# Patient Record
Sex: Male | Born: 2000 | Race: Black or African American | Hispanic: No | Marital: Single | Smoking: Never smoker
Health system: Southern US, Community
[De-identification: ages and names within clinical notes are randomized; demographics above are authoritative.]

## PROBLEM LIST (undated history)

## (undated) DIAGNOSIS — J40 Bronchitis, not specified as acute or chronic: Secondary | ICD-10-CM

## (undated) DIAGNOSIS — L309 Dermatitis, unspecified: Secondary | ICD-10-CM

## (undated) HISTORY — PX: OTHER SURGICAL HISTORY: SHX169

## (undated) HISTORY — PX: HERNIA REPAIR: SHX51

---
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
## 2022-01-01 ENCOUNTER — Ambulatory Visit: Payer: Medicaid Other | Admitting: Internal Medicine

## 2022-01-07 NOTE — Progress Notes (Unsigned)
FOLLOW UP Date of Service/Encounter:  01/08/22   Subjective:  Caleb Clarke (DOB: 04/18/2000) is a 21 y.o. male who [facility] in re-evaluation of the following: asthma, allergic rhinitis  History obtained from: chart review and patient.  For Review, LV was on 10/02/21  with Dr.Brylin Stanislawski seen for routine follow-up. His FEV1 was 38%.  He reported improvement since starting Fasenra, but had ran out of his Symbicort prior to that visit.  Had only received 2 Fasenra injections.  We refilled his medications, and had him continue rest of plan.  ----------------------- Pertinent history/diagnostics: Asthma:  diagnosed in early childhood, 2 lifetime hospitalizations, COVID-19 infection in 2021, wheezing multiple times per week. . Not controlled on Symbicort 160, 2 puffs BID.  Fasenra started 08/14/2021 -- 07/10/21 spirometry: ratio 70%, 49% FEV1 (pre), Spirometry consistent with mixed obstructive and restrictive disease without significant bronchodilator response -- CXR 04/30/18: normal read, AEC 0 -- 07/10/21: AEC 200, IgE 871 Allergic Rhinitis:  -- SPT environmental panel (07/10/21): positive to grass pollen, weed pollen, tree pollen, indoor/outdoor pollens, dust mites, cat, horse and cockroach Food Allergy (shellfish) Hx of reaction: none; has avoided since childhood due to strong family history of shellfish allergy -- SPT select foods (07/10/21): + shellfish Hx of penicillin allergy:  doesn't remember history, told this since childhood-challenge offered ------------------------------------ Today presents for follow-up. He did have an asthma flare earlier this month and was evaluated by PCP during this time.  He was given trelegy to start. However, he has been taking Trelegy along with his Symbicort and Spiriva. He was given prednisone in office, but he did not continue for the 5 day course. He also got a nebulizer in clinic.  He was also given  prednisone following an ED visit in September for body aches. Household was sick at this time.  He was also treated with albuterol during this ED visit. Since September he feels his asthma has not been controlled. He is still using rescue inhaler at least 2-3 times per week. Using in setting of work, when he physically or emotionally exerts himself.  He feels better after using his rescue inhaler.   He has had no accidental exposures to shellfish.   He does feel that his allergies are controlled with Zyrtec and Singulair plus Flonase.  Allergies as of 01/08/2022       Reactions   Shellfish Allergy    Amoxicillin Other (See Comments)   unknown   Penicillins Rash   REACTION: rash        Medication List        Accurate as of January 08, 2022  5:26 PM. If you have any questions, ask your nurse or doctor.          albuterol (2.5 MG/3ML) 0.083% nebulizer solution Commonly known as: PROVENTIL Take 3 mLs (2.5 mg total) by nebulization every 4 (four) hours as needed for wheezing or shortness of breath.   albuterol 108 (90 Base) MCG/ACT inhaler Commonly known as: VENTOLIN HFA Inhale 2 puffs into the lungs every 4 (four) hours as needed for wheezing or shortness of breath.   cetirizine 10 MG tablet Commonly known as: ZYRTEC Take 1 tablet (10 mg total) by mouth daily.   cromolyn 4 % ophthalmic solution Commonly known as: OPTICROM Place 1 drop into both eyes 4 (four) times daily as needed.   EPINEPHrine 0.3 mg/0.3 mL Soaj injection Commonly known as: EpiPen 2-Pak Inject 0.3 mg into the muscle  once as needed for up to 1 dose for anaphylaxis.   fluticasone 50 MCG/ACT nasal spray Commonly known as: FLONASE Place 2 sprays into both nostrils daily.   montelukast 10 MG tablet Commonly known as: SINGULAIR Take 1 tablet (10 mg total) by mouth at bedtime.   Olopatadine HCl 0.2 % Soln Apply 1 drop to eye daily as needed.   Trelegy Ellipta 100-62.5-25 MCG/ACT Aepb Generic drug:  Fluticasone-Umeclidin-Vilant Inhale 1 puff into the lungs daily.       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  ROS: All others negative except as noted per HPI.   Objective:  BP 128/72   Pulse (!) 105   Temp 98.7 F (37.1 C)   Resp 20   Ht 5\' 7"  (1.702 m)   Wt 275 lb 11.2 oz (125.1 kg)   SpO2 96%   BMI 43.18 kg/m  Body mass index is 43.18 kg/m. Physical Exam: General Appearance:  Alert, cooperative, no distress, appears stated age  Head:  Normocephalic, without obvious abnormality, atraumatic  Eyes:  Conjunctiva clear, EOM's intact  Nose: Nares normal,  nasal crease, hypertrophic turbinates, normal mucosa, no visible anterior polyps, and septum midline  Throat: Lips, tongue normal; teeth and gums normal, normal posterior oropharynx  Neck: Supple, symmetrical  Lungs:   clear to auscultation bilaterally, Respirations unlabored, no coughing  Heart:  regular rate and rhythm and no murmur, Appears well perfused  Extremities: No edema  Skin: Skin color, texture, turgor normal, no rashes or lesions on visualized portions of skin  Neurologic: No gross deficits   Spirometry:  Tracings reviewed. His effort: Good reproducible efforts. FVC: 2.97L FEV1: 1.96L, 53% predicted FEV1/FVC ratio: 76% Interpretation: Spirometry consistent with mixed obstructive and restrictive disease.  Please see scanned spirometry results for details.  Assessment/Plan   Severe Persistent Asthma: not controlled Breathing test continues to show obstruction, but improved from prior visit -  Controller Inhaler: Continue Trelegy 1 puff daily. Do not use Symbicort or Spiriva with Trelegy - Rinse mouth out after use - Rescue Inhaler: Albuterol (Proair/Ventolin) 2 puffs  or 1 vial via nebulizer. Use  every 4-6 hours as needed for chest tightness, wheezing, or coughing.  Can also use 15 minutes prior to exercise if you have symptoms with activity. - Asthma is not controlled if:  - Symptoms are occurring >2 times a week OR  - >2 times a month nighttime awakenings  - You are requiring systemic steroids (prednisone/steroid injections) more than once per year  - Your require hospitalization for your asthma. - will try to get you approved for Tezspire-a different injectable asthma medcation  Seasonal and perennial allergic: Controlled - allergen avoidance towards grass pollen, weed pollen, tree pollen, indoor/outdoor pollens, dust mites, cat, horse and cockroach - consider allergy shots once asthma control - Continue Flonase (fluticasone)1- 2 sprays in each nostril daily - Singulair (Montelukast) 10mg  nightly. - Continue  Zyrtec (Cetirizine) 10mg  daily or daily as needed  Allergic Conjunctivitis: Controlled - Continue Cromolyn 1 drop in each eye up to 4  times daily as needed.   Penicillin allergy: Stable - please schedule follow-up appt at your convenience for penicillin testing followed by graded oral challenge if indicated  Food allergy: Stable - please strictly avoid shellfish - for SKIN only reaction, okay to take Benadryl 2 capsules every 4 hours - for SKIN + ANY additional symptoms, OR IF concern for LIFE THREATENING reaction = Epipen Autoinjector EpiPen 0.3 mg. - If using Epinephrine autoinjector, call 911 - A food allergy action plan has been provided and discussed. - Medic Alert identification is recommended.  Follow-up in 3 months, sooner if needed.  We will try switching you from [place] to [place].   Tonny Bollman, MD  [facility]

## 2022-01-08 ENCOUNTER — Ambulatory Visit (INDEPENDENT_AMBULATORY_CARE_PROVIDER_SITE_OTHER): Payer: Medicaid Other | Admitting: Internal Medicine

## 2022-01-08 ENCOUNTER — Encounter: Payer: Self-pay | Admitting: Internal Medicine

## 2022-01-08 VITALS — BP 128/72 | HR 105 | Temp 98.7°F | Resp 20 | Ht 67.0 in | Wt 275.7 lb

## 2022-01-08 DIAGNOSIS — Z880 Allergy status to penicillin: Secondary | ICD-10-CM

## 2022-01-08 DIAGNOSIS — T7800XA Anaphylactic reaction due to unspecified food, initial encounter: Secondary | ICD-10-CM

## 2022-01-08 DIAGNOSIS — Z91013 Allergy to seafood: Secondary | ICD-10-CM

## 2022-01-08 DIAGNOSIS — H1013 Acute atopic conjunctivitis, bilateral: Secondary | ICD-10-CM

## 2022-01-08 DIAGNOSIS — J4550 Severe persistent asthma, uncomplicated: Secondary | ICD-10-CM | POA: Diagnosis not present

## 2022-01-08 DIAGNOSIS — J302 Other seasonal allergic rhinitis: Secondary | ICD-10-CM

## 2022-01-08 DIAGNOSIS — J3089 Other allergic rhinitis: Secondary | ICD-10-CM | POA: Diagnosis not present

## 2022-01-08 NOTE — Patient Instructions (Addendum)
Severe Persistent Asthma: not controlled - Controller Inhaler: Continue Trelegy 1 puff daily. Do not use Symbicort or Spiriva with Trelegy - Rinse mouth out after use - Rescue Inhaler: Albuterol (Proair/Ventolin) 2 puffs  or 1 vial via nebulizer. Use  every 4-6 hours as needed for chest tightness, wheezing, or coughing.  Can also use 15 minutes prior to exercise if you have symptoms with activity. - Asthma is not controlled if:  - Symptoms are occurring >2 times a week OR  - >2 times a month nighttime awakenings  - You are requiring systemic steroids (prednisone/steroid injections) more than once per year  - Your require hospitalization for your asthma. - will try to get you approved for Tezspire-a different injectable asthma medcation  Seasonal and perennial allergic: - allergen avoidance towards grass pollen, weed pollen, tree pollen, indoor/outdoor pollens, dust mites, cat, horse and cockroach - consider allergy shots once asthma controlled - Continue Flonase (fluticasone)1- 2 sprays in each nostril daily - Singulair (Montelukast) 10mg  nightly. - Continue  Zyrtec (Cetirizine) 10mg  daily or daily as needed  Allergic Conjunctivitis:  - Continue Cromolyn 1 drop in each eye up to 4 times daily as needed.   Penicillin allergy: - please schedule follow-up appt at your convenience for penicillin testing followed by graded oral challenge if indicated  Food allergy:  - please strictly avoid shellfish - for SKIN only reaction, okay to take Benadryl 2 capsules every 4 hours - for SKIN + ANY additional symptoms, OR IF concern for LIFE THREATENING reaction = Epipen Autoinjector EpiPen 0.3 mg. - If using Epinephrine autoinjector, call 911 - A food allergy action plan has been provided and discussed. - Medic Alert identification is recommended.  Follow-up in 3 months, sooner if needed.  We will try switching you from to [place].   It was a pleasure seeing you again today.  [place], MD [facility]   Reducing Pollen Exposure  The American Academy of Allergy, Asthma and Immunology suggests the following steps to reduce your exposure to pollen during allergy seasons.    Do not hang sheets or clothing out to dry; pollen may collect on these items. Do not mow lawns or spend time around freshly cut grass; mowing stirs up pollen. Keep windows closed at night.  Keep car windows closed while driving. Minimize morning activities outdoors, a time when pollen counts are usually at their highest. Stay indoors as much as possible when pollen counts or humidity is high and on windy days when pollen tends to remain in the air longer. Use air conditioning when possible.  Many air conditioners have filters that trap the pollen spores. Use a HEPA room air filter to remove pollen form the indoor air you breathe.  Control of Mold Allergen   Mold and fungi can grow on a variety of surfaces provided certain temperature and moisture conditions exist.  Outdoor molds grow on plants, decaying vegetation and soil.  The major outdoor mold, Alternaria and Cladosporium, are found in very high numbers during hot and dry conditions.  Generally, a late Summer - Fall peak is seen for common outdoor fungal spores.  Rain will temporarily lower outdoor mold spore count, but counts rise rapidly when the rainy period ends.  The most important indoor molds are Aspergillus and Penicillium.  Dark, humid and poorly ventilated basements are ideal sites for mold growth.  The next most common sites of mold growth are the bathroom and the kitchen.  Outdoor (Seasonal)  Mold Control  Use air conditioning and keep windows closed Avoid exposure to decaying vegetation. Avoid leaf raking. Avoid grain handling. Consider wearing a face mask if working in moldy areas.    Indoor (Perennial) Mold Control   Maintain humidity below 50%. Clean washable surfaces with 5% bleach solution. Remove  sources e.g. contaminated carpets.    DUST MITE AVOIDANCE MEASURES:  There are three main measures that need and can be taken to avoid house dust mites:  Reduce accumulation of dust in general -reduce furniture, clothing, carpeting, books, stuffed animals, especially in bedroom  Separate yourself from the dust -use pillow and mattress encasements (can be found at stores such as [employer], [employer] or online) -avoid direct exposure to air condition flow -use a HEPA filter device, especially in the bedroom; you can also use a HEPA filter vacuum cleaner -wipe dust with a moist towel instead of a dry towel or broom when cleaning  Decrease mites and/or their secretions -wash clothing and linen and stuffed animals at highest temperature possible, at least every 2 weeks -stuffed animals can also be placed in a bag and put in a freezer overnight  Despite the above measures, it is impossible to eliminate dust mites or their allergen completely from your home.  With the above measures the burden of mites in your home can be diminished, with the goal of minimizing your allergic symptoms.  Success will be reached only when implementing and using all means together.  Control of Dog or Cat Allergen  Avoidance is the best way to manage a dog or cat allergy. If you have a dog or cat and are allergic to dog or cats, consider removing the dog or cat from the home. If you have a dog or cat but don't want to find it a new home, or if your family wants a pet even though someone in the household is allergic, here are some strategies that may help keep symptoms at bay:  Keep the pet out of your bedroom and restrict it to only a few rooms. Be advised that keeping the dog or cat in only one room will not limit the allergens to that room. Don't pet, hug or kiss the dog or cat; if you do, wash your hands with soap and water. High-efficiency particulate air (HEPA) cleaners run continuously in a bedroom or living  room can reduce allergen levels over time. Regular use of a high-efficiency vacuum cleaner or a central vacuum can reduce allergen levels. Giving your dog or cat a bath at least once a week can reduce airborne allergen.  Control of Cockroach Allergen  Cockroach allergen has been identified as an important cause of acute attacks of asthma, especially in urban settings.  There are fifty-five species of cockroach that exist in the [place], however only three, the [place], [place] species produce allergen that can affect patients with Asthma.  Allergens can be obtained from fecal particles, egg casings and secretions from cockroaches.    Remove food sources. Reduce access to water. Seal access and entry points. Spray runways with 0.5-1% Diazinon or Chlorpyrifos Blow boric acid power under stoves and refrigerator. Place bait stations (hydramethylnon) at feeding sites.

## 2022-01-09 ENCOUNTER — Telehealth: Payer: Self-pay | Admitting: *Deleted

## 2022-01-09 NOTE — Telephone Encounter (Signed)
-----   Message from Verlee Monte, MD sent at 01/08/2022  5:26 PM EST ----- Hi Lorraine Cimmino.  He has continued to flare while on Fasenra.  Can we see about getting him approved for Tezspire?

## 2022-01-09 NOTE — Telephone Encounter (Signed)
L/m for patient to advised approval and change to Tezspire to [employer]. Will reach out with delviery date to schedule start

## 2022-01-22 NOTE — Telephone Encounter (Signed)
L/m for patient to contact me  

## 2022-01-24 MED ORDER — TEZSPIRE 210 MG/1.91ML ~~LOC~~ SOSY: 210.0000 mg | PREFILLED_SYRINGE | SUBCUTANEOUS | 11 refills | Status: DC

## 2022-01-24 NOTE — Telephone Encounter (Signed)
Patient called and was advised of approval and submit to [employer] will reach out to patient when delivery set to make appt to start therapy

## 2022-01-24 NOTE — Telephone Encounter (Signed)
Patient had called and I called him back and l/m

## 2022-02-03 ENCOUNTER — Ambulatory Visit: Payer: Medicaid Other

## 2022-02-25 ENCOUNTER — Ambulatory Visit (INDEPENDENT_AMBULATORY_CARE_PROVIDER_SITE_OTHER): Payer: Medicaid Other

## 2022-02-25 DIAGNOSIS — J4550 Severe persistent asthma, uncomplicated: Secondary | ICD-10-CM

## 2022-02-25 MED ORDER — TEZEPELUMAB-EKKO 210 MG/1.91ML ~~LOC~~ SOSY
210.0000 mg | PREFILLED_SYRINGE | SUBCUTANEOUS | Status: AC
  Administered 2022-02-25 – 2022-04-10 (×2): 210 mg via SUBCUTANEOUS

## 2022-02-25 NOTE — Progress Notes (Signed)
Immunotherapy   Patient Details  Name: Caleb Clarke MRN: [id number] Date of Birth: August 22, 2000  02/25/2022  Caleb Clarke here to start Tezspire for asthma. Patient switched from [place] to [place]. Patient received 210 mg of Tezspire in office and waited 30 minutes with no problems.   Frequency:every 28 days Epi-Pen:Epi-Pen Available  Consent signed and patient instructions given.   Herbie Drape 02/25/2022, 1:33 PM

## 2022-03-14 ENCOUNTER — Other Ambulatory Visit: Payer: Self-pay | Admitting: *Deleted

## 2022-03-14 MED ORDER — TEZSPIRE 210 MG/1.91ML ~~LOC~~ SOSY: 210.0000 mg | PREFILLED_SYRINGE | SUBCUTANEOUS | 11 refills | Status: AC

## 2022-03-25 ENCOUNTER — Ambulatory Visit: Payer: Medicaid Other

## 2022-04-07 ENCOUNTER — Ambulatory Visit: Payer: Medicaid Other

## 2022-04-10 ENCOUNTER — Ambulatory Visit (INDEPENDENT_AMBULATORY_CARE_PROVIDER_SITE_OTHER): Payer: Medicaid Other

## 2022-04-10 DIAGNOSIS — J4550 Severe persistent asthma, uncomplicated: Secondary | ICD-10-CM | POA: Diagnosis not present

## 2022-04-15 NOTE — Progress Notes (Unsigned)
FOLLOW UP Date of Service/Encounter:  04/15/22   Subjective:  Caleb Clarke (DOB: Aug 30, 2000) is a 22 y.o. male who returns to the [facility] on 04/16/2022 in re-evaluation of the following:  asthma, allergic rhinitis  History obtained from: chart review and {Persons; PED relatives w/patient:19415::"patient"}.  For Review, LV was on 01/08/22  with Dr.Aleia Larocca seen for routine follow-up. At that visit, he reported an asthma flare despite being on Symbicort and Spiriva and Trelegy.  FEV1 53%.  We switched him from fasenra to tezspire sine he had required multiple systemic steroids since prior visit due to uncontrolled asthma.  Pertinent history/diagnostics: Asthma:  diagnosed in early childhood, 2 lifetime hospitalizations, COVID-19 infection in 2021, wheezing multiple times per week. . Not controlled on Symbicort 160, 2 puffs BID.  Fasenra started 08/14/2021 -- 07/10/21 spirometry: ratio 70%, 49% FEV1 (pre), Spirometry consistent with mixed obstructive and restrictive disease without significant bronchodilator response -- CXR 04/30/18: normal read, AEC 0 -- 07/10/21: AEC 200, IgE 871 Allergic Rhinitis:  -- SPT environmental panel (07/10/21): positive to grass pollen, weed pollen, tree pollen, indoor/outdoor pollens, dust mites, cat, horse and cockroach Food Allergy (shellfish) Hx of reaction: none; has avoided since childhood due to strong family history of shellfish allergy -- SPT select foods (07/10/21): + shellfish Hx of penicillin allergy:  doesn't remember history, told this since childhood-challenge offered -------------------------------------------- Today presents for follow-up. ***  Allergies as of 04/16/2022       Reactions   Shellfish Allergy    Amoxicillin Other (See Comments)   unknown   Penicillins Rash   REACTION: rash        Medication List        Accurate as of April 15, 2022  5:13 PM. If you have any questions, ask your nurse or doctor.           albuterol (2.5 MG/3ML) 0.083% nebulizer solution Commonly known as: PROVENTIL Take 3 mLs (2.5 mg total) by nebulization every 4 (four) hours as needed for wheezing or shortness of breath.   albuterol 108 (90 Base) MCG/ACT inhaler Commonly known as: VENTOLIN HFA Inhale 2 puffs into the lungs every 4 (four) hours as needed for wheezing or shortness of breath.   cetirizine 10 MG tablet Commonly known as: ZYRTEC Take 1 tablet (10 mg total) by mouth daily.   cromolyn 4 % ophthalmic solution Commonly known as: OPTICROM Place 1 drop into both eyes 4 (four) times daily as needed.   EPINEPHrine 0.3 mg/0.3 mL Soaj injection Commonly known as: EpiPen 2-Pak Inject 0.3 mg into the muscle once as needed for up to 1 dose for anaphylaxis.   fluticasone 50 MCG/ACT nasal spray Commonly known as: FLONASE Place 2 sprays into both nostrils daily.   montelukast 10 MG tablet Commonly known as: SINGULAIR Take 1 tablet (10 mg total) by mouth at bedtime.   Olopatadine HCl 0.2 % Soln Apply 1 drop to eye daily as needed.   Tezspire 210 MG/1.91ML syringe Generic drug: tezepelumab-ekko Inject 1.91 mLs (210 mg total) into the skin every 28 (twenty-eight) days.   Trelegy Ellipta 100-62.5-25 MCG/ACT Aepb Generic drug: Fluticasone-Umeclidin-Vilant Inhale 1 puff into the lungs daily.       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  ROS: All others negative except as noted per HPI.   Objective:  There were no vitals taken for this visit. There is no height or weight on file to  calculate BMI. Physical Exam: General Appearance:  Alert, cooperative, no distress, appears stated age  Head:  Normocephalic, without obvious abnormality, atraumatic  Eyes:  Conjunctiva clear, EOM's intact  Nose: Nares normal, {Blank multiple:19196:a:"***","hypertrophic turbinates","normal mucosa","no visible anterior polyps","septum midline"}  Throat: Lips, tongue normal; teeth and gums normal, {Blank multiple:19196:a:"***","normal posterior oropharynx","tonsils 2+","tonsils 3+","no tonsillar exudate","+ cobblestoning"}  Neck: Supple, symmetrical  Lungs:   {Blank multiple:19196:a:"***","clear to auscultation bilaterally","end-expiratory wheezing","wheezing throughout"}, Respirations unlabored, {Blank multiple:19196:a:"***","no coughing","intermittent dry coughing"}  Heart:  {Blank multiple:19196:a:"***","regular rate and rhythm","no murmur"}, Appears well perfused  Extremities: No edema  Skin: Skin color, texture, turgor normal, no rashes or lesions on visualized portions of skin  Neurologic: No gross deficits   Reviewed: ***  Spirometry:  Tracings reviewed. His effort: {Blank single:19197::"Good reproducible efforts.","It was hard to get consistent efforts and there is a question as to whether this reflects a maximal maneuver.","Poor effort, data can not be interpreted.","Variable effort-results affected.","decent for first attempt at spirometry."} FVC: ***L FEV1: ***L, ***% predicted FEV1/FVC ratio: ***% Interpretation: {Blank single:19197::"Spirometry consistent with mild obstructive disease","Spirometry consistent with moderate obstructive disease","Spirometry consistent with severe obstructive disease","Spirometry consistent with possible restrictive disease","Spirometry consistent with mixed obstructive and restrictive disease","Spirometry uninterpretable due to technique","Spirometry consistent with normal pattern","No overt abnormalities noted given today's efforts"}.  Please see  scanned spirometry results for details.  Skin Testing: {Blank single:19197::"Select foods","Environmental allergy panel","Environmental allergy panel and select foods","Food allergy panel","None","Deferred due to recent antihistamines use","deferred due to recent reaction"}. ***Adequate positive and negative controls Results discussed with patient/family.   {Blank single:19197::"Allergy testing results were read and interpreted by myself, documented by clinical staff."," "}  Assessment/Plan   ***  Sigurd Sos, MD  [facility]

## 2022-04-16 ENCOUNTER — Encounter: Payer: Self-pay | Admitting: Internal Medicine

## 2022-04-16 ENCOUNTER — Ambulatory Visit (INDEPENDENT_AMBULATORY_CARE_PROVIDER_SITE_OTHER): Payer: Medicaid Other | Admitting: Internal Medicine

## 2022-04-16 ENCOUNTER — Other Ambulatory Visit: Payer: Self-pay

## 2022-04-16 VITALS — BP 114/78 | HR 105 | Temp 98.2°F | Resp 20 | Wt 274.8 lb

## 2022-04-16 DIAGNOSIS — J302 Other seasonal allergic rhinitis: Secondary | ICD-10-CM

## 2022-04-16 DIAGNOSIS — J4550 Severe persistent asthma, uncomplicated: Secondary | ICD-10-CM

## 2022-04-16 DIAGNOSIS — H1013 Acute atopic conjunctivitis, bilateral: Secondary | ICD-10-CM | POA: Diagnosis not present

## 2022-04-16 DIAGNOSIS — J3089 Other allergic rhinitis: Secondary | ICD-10-CM | POA: Diagnosis not present

## 2022-04-16 DIAGNOSIS — T7800XD Anaphylactic reaction due to unspecified food, subsequent encounter: Secondary | ICD-10-CM

## 2022-04-16 DIAGNOSIS — T7800XA Anaphylactic reaction due to unspecified food, initial encounter: Secondary | ICD-10-CM

## 2022-04-16 MED ORDER — TRELEGY ELLIPTA 200-62.5-25 MCG/ACT IN AEPB: 1.0000 | INHALATION_SPRAY | Freq: Every day | RESPIRATORY_TRACT | 5 refills | Status: DC

## 2022-04-16 MED ORDER — MONTELUKAST SODIUM 10 MG PO TABS: 10.0000 mg | ORAL_TABLET | Freq: Every day | ORAL | 1 refills | Status: DC

## 2022-04-16 MED ORDER — ALBUTEROL SULFATE HFA 108 (90 BASE) MCG/ACT IN AERS: 2.0000 | INHALATION_SPRAY | RESPIRATORY_TRACT | 6 refills | Status: DC | PRN

## 2022-04-16 MED ORDER — CETIRIZINE HCL 10 MG PO TABS: 10.0000 mg | ORAL_TABLET | Freq: Every day | ORAL | 5 refills | Status: AC

## 2022-04-16 MED ORDER — EPINEPHRINE 0.3 MG/0.3ML IJ SOAJ: 0.3000 mg | Freq: Once | INTRAMUSCULAR | 2 refills | Status: AC | PRN

## 2022-04-16 MED ORDER — FLUTICASONE PROPIONATE 50 MCG/ACT NA SUSP: 2.0000 | Freq: Every day | NASAL | 6 refills | Status: AC

## 2022-04-16 NOTE — Patient Instructions (Addendum)
Severe Persistent Asthma: - Controller Inhaler: Continue Trelegy 200 mcg 1 puff daily.  - Rinse mouth out after use - Rescue Inhaler: Albuterol (Proair/Ventolin) 2 puffs  or 1 vial via nebulizer. Use  every 4-6 hours as needed for chest tightness, wheezing, or coughing.  Can also use 15 minutes prior to exercise if you have symptoms with activity. - Asthma is not controlled if:  - Symptoms are occurring >2 times a week OR  - >2 times a month nighttime awakenings  - You are requiring systemic steroids (prednisone/steroid injections) more than once per year  - Your require hospitalization for your asthma. Continue Tezspire per protocol.  Seasonal and perennial allergic: - allergen avoidance towards grass pollen, weed pollen, tree pollen, indoor/outdoor pollens, dust mites, cat, horse and cockroach - consider allergy shots once asthma controlled - Continue Flonase (fluticasone)1- 2 sprays in each nostril daily - Singulair (Montelukast) 16m nightly. - Continue  Zyrtec (Cetirizine) 114mdaily or daily as needed  Allergic Conjunctivitis:  - Continue Cromolyn 1 drop in each eye up to 4 times daily as needed.   Penicillin allergy: - please schedule follow-up appt at your convenience for penicillin testing followed by graded oral challenge if indicated  Food allergy:  - please strictly avoid shellfish - for SKIN only reaction, okay to take Benadryl 2 capsules every 4 hours - for SKIN + ANY additional symptoms, OR IF concern for LIFE THREATENING reaction = Epipen Autoinjector EpiPen 0.3 mg. - If using Epinephrine autoinjector, call 911 - A food allergy action plan has been provided and discussed. - Medic Alert identification is recommended.  Follow-up in 6 months, sooner if needed.  It was a pleasure seeing you again today.  ErSigurd SosMD [facility]   Reducing Pollen Exposure  The American Academy of Allergy, Asthma and Immunology suggests the following steps to  reduce your exposure to pollen during allergy seasons.    Do not hang sheets or clothing out to dry; pollen may collect on these items. Do not mow lawns or spend time around freshly cut grass; mowing stirs up pollen. Keep windows closed at night.  Keep car windows closed while driving. Minimize morning activities outdoors, a time when pollen counts are usually at their highest. Stay indoors as much as possible when pollen counts or humidity is high and on windy days when pollen tends to remain in the air longer. Use air conditioning when possible.  Many air conditioners have filters that trap the pollen spores. Use a HEPA room air filter to remove pollen form the indoor air you breathe.  Control of Mold Allergen   Mold and fungi can grow on a variety of surfaces provided certain temperature and moisture conditions exist.  Outdoor molds grow on plants, decaying vegetation and soil.  The major outdoor mold, Alternaria and Cladosporium, are found in very high numbers during hot and dry conditions.  Generally, a late Summer - Fall peak is seen for common outdoor fungal spores.  Rain will temporarily lower outdoor mold spore count, but counts rise rapidly when the rainy period ends.  The most important indoor molds are Aspergillus and Penicillium.  Dark, humid and poorly ventilated basements are ideal sites for mold growth.  The next most common sites of mold growth are the bathroom and the kitchen.  Outdoor (Seasonal) Mold Control  Use air conditioning and keep windows closed Avoid exposure to decaying vegetation. Avoid leaf raking. Avoid grain handling. Consider wearing a face mask if  working in [employer] areas.    Indoor (Perennial) Mold Control   Maintain humidity below 50%. Clean washable surfaces with 5% bleach solution. Remove sources e.g. contaminated carpets.    DUST MITE AVOIDANCE MEASURES:  There are three main measures that need and can be taken to avoid house dust  mites:  Reduce accumulation of dust in general -reduce furniture, clothing, carpeting, books, stuffed animals, especially in bedroom  Separate yourself from the dust -use pillow and mattress encasements (can be found at stores such as [employer], [employer] or online) -avoid direct exposure to air condition flow -use a HEPA filter device, especially in the bedroom; you can also use a HEPA filter vacuum cleaner -wipe dust with a moist towel instead of a dry towel or broom when cleaning  Decrease mites and/or their secretions -wash clothing and linen and stuffed animals at highest temperature possible, at least every 2 weeks -stuffed animals can also be placed in a bag and put in a freezer overnight  Despite the above measures, it is impossible to eliminate dust mites or their allergen completely from your home.  With the above measures the burden of mites in your home can be diminished, with the goal of minimizing your allergic symptoms.  Success will be reached only when implementing and using all means together.  Control of Dog or Cat Allergen  Avoidance is the best way to manage a dog or cat allergy. If you have a dog or cat and are allergic to dog or cats, consider removing the dog or cat from the home. If you have a dog or cat but don't want to find it a new home, or if your family wants a pet even though someone in the household is allergic, here are some strategies that may help keep symptoms at bay:  Keep the pet out of your bedroom and restrict it to only a few rooms. Be advised that keeping the dog or cat in only one room will not limit the allergens to that room. Don't pet, hug or kiss the dog or cat; if you do, wash your hands with soap and water. High-efficiency particulate air (HEPA) cleaners run continuously in a bedroom or living room can reduce allergen levels over time. Regular use of a high-efficiency vacuum cleaner or a central vacuum can reduce allergen levels. Giving  your dog or cat a bath at least once a week can reduce airborne allergen.  Control of Cockroach Allergen  Cockroach allergen has been identified as an important cause of acute attacks of asthma, especially in urban settings.  There are fifty-five species of cockroach that exist in the [place], however only three, the [place], [place] species produce allergen that can affect patients with Asthma.  Allergens can be obtained from fecal particles, egg casings and secretions from cockroaches.    Remove food sources. Reduce access to water. Seal access and entry points. Spray runways with 0.5-1% Diazinon or Chlorpyrifos Blow boric acid power under stoves and refrigerator. Place bait stations (hydramethylnon) at feeding sites.

## 2022-04-16 NOTE — Progress Notes (Signed)
FOLLOW UP Date of Service/Encounter:  04/16/22   Subjective:  Caleb Clarke (DOB: 2000-08-09) is a 22 y.o. male who returns to the [facility] [place] on 04/16/2022 in re-evaluation of the following:  asthma, allergic rhinitis  History obtained from: chart review and patient.  For Review, LV was on 01/08/22  with Dr.Natahlia Hoggard seen for routine follow-up. At that visit, he reported an asthma flare despite being on Symbicort and Spiriva and Trelegy.  FEV1 53%.  We switched him from fasenra to tezspire sine he had required multiple systemic steroids since prior visit due to uncontrolled asthma.  Pertinent history/diagnostics: Asthma:  diagnosed in early childhood, 2 lifetime hospitalizations, COVID-19 infection in 2021, wheezing multiple times per week. . Not controlled on Symbicort 160, 2 puffs BID.  Fasenra started 08/14/2021. Not controlled. Tezspire started 02/25/22. -- 07/10/21 spirometry: ratio 70%, 49% FEV1 (pre), Spirometry consistent with mixed obstructive and restrictive disease without significant bronchodilator response -- CXR 04/30/18: normal read, AEC 0 -- 07/10/21: AEC 200, IgE 871 Allergic Rhinitis:  -- SPT environmental panel (07/10/21): positive to grass pollen, weed pollen, tree pollen, indoor/outdoor pollens, dust mites, cat, horse and cockroach Food Allergy (shellfish) Hx of reaction: none; has avoided since childhood due to strong family history of shellfish allergy -- SPT select foods (07/10/21): + shellfish Hx of penicillin allergy:  doesn't remember history, told this since childhood-challenge offered -------------------------------------------- Today presents for follow-up. Started Tezspire in January and has had 2 doses. Notices breathing more calm after injections but worse when close to being due for next injection. Last dose was one week ago. He has not needed albuterol since last injection.  Needs albuterol 2-3 times a week 1-2 weeks prior to next dose  Tezspire. This is a big improvement from prior.  No reported ED visits since last visit.   Allergies as of 04/16/2022       Reactions   Shellfish Allergy    Amoxicillin Other (See Comments)   unknown   Penicillins Rash   REACTION: rash        Medication List        Accurate as of April 16, 2022  3:42 PM. If you have any questions, ask your nurse or doctor.          albuterol (2.5 MG/3ML) 0.083% nebulizer solution Commonly known as: PROVENTIL Take 3 mLs (2.5 mg total) by nebulization every 4 (four) hours as needed for wheezing or shortness of breath.   albuterol 108 (90 Base) MCG/ACT inhaler Commonly known as: VENTOLIN HFA Inhale 2 puffs into the lungs every 4 (four) hours as needed for wheezing or shortness of breath.   cetirizine 10 MG tablet Commonly known as: ZYRTEC Take 1 tablet (10 mg total) by mouth daily.   cromolyn 4 % ophthalmic solution Commonly known as: OPTICROM Place 1 drop into both eyes 4 (four) times daily as needed.   EPINEPHrine 0.3 mg/0.3 mL Soaj injection Commonly known as: EpiPen 2-Pak Inject 0.3 mg into the muscle once as needed for up to 1 dose for anaphylaxis.   fluticasone 50 MCG/ACT nasal spray Commonly known as: FLONASE Place 2 sprays into both nostrils daily.   montelukast 10 MG tablet Commonly known as: SINGULAIR Take 1 tablet (10 mg total) by mouth at bedtime.   Olopatadine HCl 0.2 % Soln Apply 1 drop to eye daily as needed.   Tezspire 210 MG/1.91ML syringe Generic drug: tezepelumab-ekko Inject 1.91 mLs (210 mg total) into the skin every 28 (twenty-eight) days.  Trelegy Ellipta 100-62.5-25 MCG/ACT Aepb Generic drug: Fluticasone-Umeclidin-Vilant Inhale 1 puff into the lungs daily.       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  ROS: All others negative except as noted per HPI.   Objective:  BP 114/78   Pulse (!) 105   Temp 98.2 F (36.8 C) (Temporal)   Resp 20   Wt 274 lb 12.8 oz (124.6 kg)   SpO2 95%   BMI 43.04 kg/m  Body mass index is 43.04 kg/m. Physical Exam: General Appearance:  Alert, cooperative, no distress, appears stated age  Head:  Normocephalic, without obvious abnormality, atraumatic  Eyes:  Conjunctiva clear, EOM's intact  Nose: Nares normal,  erythema of the L nasal turbinates normal on R, no visible anterior polyps, and septum midline  Throat: Lips, tongue normal; teeth and gums normal, normal posterior oropharynx and no tonsillar exudate  Neck: Supple, symmetrical  Lungs:   Diminished breath sounds bilaterally and clear to auscultation bilaterally, Respirations unlabored, no coughing  Heart:  Mild tachycardia, regular rhythm,  and no murmur, Appears well perfused  Extremities: No edema  Skin: Skin color, texture, turgor normal, no rashes or lesions on visualized portions of skin  Neurologic: No gross deficits   Spirometry:  Tracings reviewed. His effort: Good reproducible efforts. FVC: 3.30L FEV1: 2.05L, 59% predicted FEV1/FVC ratio: 0.62% Interpretation: Spirometry consistent with moderate obstructive disease.  Please see scanned spirometry results for details.  Assessment/Plan  Severe persistent asthma-chronic, improved. -Continue Trelegy 1 puff daily -Albuterol rescue inhaler 2 puffs every 4-6 hours as needed -Continue Tezspire per protocol  -May be moving [place] in the next 2 weeks, not sure if he will be moving long term  Seasonal and perennial allergies-controlled -allergen avoidance to grass pollen, weed pollen, tree pollen, indoor/outdoor  pollens, dust mites, care, horse, and cockroach  Continue on Singulair 10 mg daily, zyrtec 10 mg daily as needed, and Flonase   Allergic Conjunctivitis-stable - Continue Cromolyn 1 drop in each eye up to 4 times daily as needed.   Food allergy-chronic, stable - continue  to strictly avoid shellfish - Benadryl for skin only reactions, Epipen autoinjector for skin reaction with additional symptoms. - A food allergy action plan has been provided and discussed.  Follow-up in 6 months, sooner if needed.   Iona Beard, MD Internal Medicine, PGY-3  I have provided oversight concerning  Dr. Iona Beard, PGY-3  evaluation and treatment of this patient's heath issues addressed during today's encounter. I agree with the assessment and plan as outlined in the note below.  Trelegy samples provided in clinic. He will call to inform us as soon as he knows if he will be staying long term in [place] to help with transfer of care.   Sigurd Sos, MD [facility]

## 2022-04-17 ENCOUNTER — Other Ambulatory Visit (HOSPITAL_COMMUNITY): Payer: Self-pay

## 2022-05-07 ENCOUNTER — Ambulatory Visit: Payer: Medicaid Other

## 2022-08-12 ENCOUNTER — Encounter: Payer: Self-pay | Admitting: Internal Medicine

## 2022-10-15 ENCOUNTER — Ambulatory Visit: Payer: Medicaid Other | Admitting: Internal Medicine

## 2022-10-15 ENCOUNTER — Encounter: Payer: Self-pay | Admitting: Family Medicine

## 2022-12-12 ENCOUNTER — Telehealth: Payer: Self-pay | Admitting: Internal Medicine

## 2022-12-12 NOTE — Telephone Encounter (Signed)
Left message to schedule Tezspire reapproval appointment.

## 2023-01-02 ENCOUNTER — Other Ambulatory Visit: Payer: Self-pay

## 2023-01-02 DIAGNOSIS — J4541 Moderate persistent asthma with (acute) exacerbation: Secondary | ICD-10-CM

## 2023-01-02 MED ORDER — ALBUTEROL SULFATE (2.5 MG/3ML) 0.083% IN NEBU: 2.5000 mg | INHALATION_SOLUTION | RESPIRATORY_TRACT | 5 refills | Status: DC | PRN

## 2023-02-16 ENCOUNTER — Other Ambulatory Visit: Payer: Self-pay

## 2023-02-16 MED ORDER — ALBUTEROL SULFATE HFA 108 (90 BASE) MCG/ACT IN AERS: 2.0000 | INHALATION_SPRAY | RESPIRATORY_TRACT | 6 refills | Status: DC | PRN

## 2023-05-23 ENCOUNTER — Other Ambulatory Visit: Payer: Self-pay | Admitting: Internal Medicine

## 2023-12-21 ENCOUNTER — Ambulatory Visit: Admitting: Family Medicine

## 2023-12-21 ENCOUNTER — Ambulatory Visit: Payer: Self-pay | Admitting: Family Medicine

## 2023-12-21 ENCOUNTER — Encounter: Payer: Self-pay | Admitting: Family Medicine

## 2023-12-21 VITALS — BP 119/79 | HR 86 | Ht 67.0 in | Wt 278.8 lb

## 2023-12-21 DIAGNOSIS — J4541 Moderate persistent asthma with (acute) exacerbation: Secondary | ICD-10-CM

## 2023-12-21 DIAGNOSIS — J029 Acute pharyngitis, unspecified: Secondary | ICD-10-CM

## 2023-12-21 LAB — POC SOFIA 2 FLU + SARS ANTIGEN FIA
Influenza A, POC: NEGATIVE
Influenza B, POC: NEGATIVE
SARS Coronavirus 2 Ag: NEGATIVE

## 2023-12-21 MED ORDER — ALBUTEROL SULFATE (2.5 MG/3ML) 0.083% IN NEBU: 2.5000 mg | INHALATION_SOLUTION | RESPIRATORY_TRACT | 5 refills | Status: AC | PRN

## 2023-12-21 MED ORDER — BUDESONIDE-FORMOTEROL FUMARATE 160-4.5 MCG/ACT IN AERO: 2.0000 | INHALATION_SPRAY | Freq: Two times a day (BID) | RESPIRATORY_TRACT | 1 refills | Status: DC

## 2023-12-21 MED ORDER — ALBUTEROL SULFATE HFA 108 (90 BASE) MCG/ACT IN AERS: 2.0000 | INHALATION_SPRAY | RESPIRATORY_TRACT | 6 refills | Status: AC | PRN

## 2023-12-21 MED ORDER — PREDNISONE 20 MG PO TABS: 40.0000 mg | ORAL_TABLET | Freq: Every day | ORAL | 0 refills | Status: AC

## 2023-12-21 NOTE — Assessment & Plan Note (Addendum)
 Acute exacerbation likely due to viral URI. Faint wheezing on exam, overall well appearing, not in acute respiratory distress. HDS on room air. Prefers Symbicort  over Trelegy. Hesitant about steroids but open if needed. - Prescribe prednisone  40 mg daily for 5 days if no improvement over the next couple of days per pt preference - Continue Symbicort  160 mcg, increase to two puffs twice daily. - Refill albuterol  inhaler and nebulizer solution, continue q4h  - Swab for COVID-19 and influenza negative - Provide work absence note  - Pt to schedule f/u with pulmonology - discussed return precautions

## 2023-12-21 NOTE — Progress Notes (Signed)
    SUBJECTIVE:   CHIEF COMPLAINT / HPI:   Discussed the use of AI scribe software for clinical note transcription with the patient, who gave verbal consent to proceed.  History of Present Illness Caleb Clarke is a 23 year old male with asthma who presents with cough and sore throat.  Upper respiratory symptoms - Sore throat onset on Friday, progressing to cough and mucus production by Saturday - Voice hoarseness with difficulty speaking, impacting ability to work in a job requiring vocal use - No fever on Friday or Saturday - little brother was sick recently  Asthma exacerbation - Increased use of nebulizer and albuterol  inhaler approximately every four hours since yesterday - Current maintenance therapy with Symbicort  160 mcg, two puffs once daily at night, switched from Trelegy due to better efficacy - Shortness of breath and decreased energy levels - Nebulizer used last night and albuterol  inhaler used this morning - History of receiving steroids for similar symptoms in the past with good effect, but prefers to avoid due to side effects - No recent pulmonology follow-up, plans to reschedule appointments. Saw someone in [place]  who prescribed the symbicort   Functional impairment - Unable to work yesterday and today due to voice changes and respiratory symptoms - Hopes to return to work by Wednesday     PERTINENT  PMH / PSH: severe persistent asthma  OBJECTIVE:   BP 119/79   Pulse 86   Ht 5' 7 (1.702 m)   Wt 278 lb 12.8 oz (126.5 kg)   SpO2 98%   BMI 43.67 kg/m    General: NAD, pleasant, able to participate in exam Cardiac: RRR, no murmurs auscultated Respiratory: faint occasional inspiratory wheezing in upper lung fields, otherwise CTAB with normal WOB on RA Abdomen: soft, non-tender, non-distended, normoactive bowel sounds Extremities: warm and well perfused, no edema or cyanosis Skin: warm and dry, no rashes noted Neuro: alert, no obvious focal deficits,  speech normal Psych: Normal affect and mood  ASSESSMENT/PLAN:    Assessment & Plan Moderate persistent asthma with acute exacerbation Sore throat Acute exacerbation likely due to viral URI. Faint wheezing on exam, overall well appearing, not in acute respiratory distress. HDS on room air. Prefers Symbicort  over Trelegy. Hesitant about steroids but open if needed. - Prescribe prednisone  40 mg daily for 5 days if no improvement over the next couple of days per pt preference - Continue Symbicort  160 mcg, increase to two puffs twice daily. - Refill albuterol  inhaler and nebulizer solution, continue q4h  - Swab for COVID-19 and influenza negative - Provide work absence note  - Pt to schedule f/u with pulmonology - discussed return precautions    Payton Coward, MD [facility]

## 2023-12-21 NOTE — Patient Instructions (Signed)
 If any of your results from today are abnormal and/or require changes to your medical care, I will give you a call. Otherwise, I will send you a letter in the mail or a message on MyChart.   Continue using your albuterol  as prescribed  I have sent in a prescription for prednisone  40 mg daily for 5 days should you decide that you need this  Take Symbicort  twice a day  Please touch base with your lung doctor to schedule an appointment soon  Let us  know if your symptoms worsen especially if you start feeling short of breath, having persistent fevers or wheezing or if your symptoms do not improve after 1 to 2 weeks

## 2023-12-23 ENCOUNTER — Other Ambulatory Visit (HOSPITAL_COMMUNITY): Payer: Self-pay

## 2023-12-24 ENCOUNTER — Other Ambulatory Visit (HOSPITAL_COMMUNITY): Payer: Self-pay

## 2024-01-11 ENCOUNTER — Ambulatory Visit: Admitting: Family Medicine

## 2024-01-11 ENCOUNTER — Encounter: Payer: Self-pay | Admitting: Family Medicine

## 2024-01-11 VITALS — BP 129/91 | HR 67 | Ht 67.0 in | Wt 284.0 lb

## 2024-01-11 DIAGNOSIS — R7303 Prediabetes: Secondary | ICD-10-CM | POA: Diagnosis not present

## 2024-01-11 DIAGNOSIS — Z23 Encounter for immunization: Secondary | ICD-10-CM | POA: Diagnosis not present

## 2024-01-11 DIAGNOSIS — J4550 Severe persistent asthma, uncomplicated: Secondary | ICD-10-CM

## 2024-01-11 DIAGNOSIS — I10 Essential (primary) hypertension: Secondary | ICD-10-CM

## 2024-01-11 DIAGNOSIS — J4540 Moderate persistent asthma, uncomplicated: Secondary | ICD-10-CM

## 2024-01-11 DIAGNOSIS — R7309 Other abnormal glucose: Secondary | ICD-10-CM | POA: Diagnosis not present

## 2024-01-11 DIAGNOSIS — Z0000 Encounter for general adult medical examination without abnormal findings: Secondary | ICD-10-CM

## 2024-01-11 MED ORDER — MONTELUKAST SODIUM 10 MG PO TABS: 10.0000 mg | ORAL_TABLET | Freq: Every day | ORAL | 1 refills | Status: AC

## 2024-01-11 NOTE — Assessment & Plan Note (Addendum)
 A1C discussed and ordered Unfortunately, did not obtain prior to leaving I have reached out to him via MyChart to return for testing

## 2024-01-11 NOTE — Assessment & Plan Note (Addendum)
 Stable on Symbicort  Recent PFT reviewed suggestive of moderate Asthma F/U with Pulm to discuss tezepelumab -ekko (TEZSPIRE ) 210 MG/1.  injection Monitor closely for now

## 2024-01-11 NOTE — Assessment & Plan Note (Addendum)
 Lifestyle modification discussed 30 mins moderate exercise 3-5 times per week DASH diet discussed Bmet, LDL offered, but he declined lab work today Monitor closely for now Consider med in the future

## 2024-01-11 NOTE — Assessment & Plan Note (Addendum)
 Diastolic HTN Lifestyle modification discussed Reduce salt intake 30 mins moderate exercise 3-5 times per week DASH diet discussed Bmet, LDL offered, but he declined lab work today Monitor closely for now Consider med in the future Advised f/u in 4-8 weeks

## 2024-01-11 NOTE — Progress Notes (Signed)
 SUBJECTIVE:   Chief compliant/HPI: annual examination  Caleb Clarke is a 23 y.o. who presents today for an annual exam.   Reviewed and updated history Yes. No new concerns.   Review of systems form notable for hx of asthma exacerbation. Currently doing well on Symbicort . Plan follow up with pulm soon.    BMI/HTN:  Doing well with exercise 60 mins walk  per week. No other concerns.    OBJECTIVE:   BP (!) 129/91   Pulse 67   Ht 5' 7 (1.702 m)   Wt 284 lb (128.8 kg)   SpO2 100%   BMI 44.48 kg/m   Physical Exam Vitals reviewed.  Constitutional:      Appearance: Normal appearance. He is obese.  Cardiovascular:     Rate and Rhythm: Normal rate and regular rhythm.     Heart sounds: Normal heart sounds. No murmur heard. Pulmonary:     Effort: Pulmonary effort is normal. No respiratory distress.     Breath sounds: Normal breath sounds. No wheezing.  Abdominal:     General: Abdomen is flat. Bowel sounds are normal. There is no distension.     Palpations: Abdomen is soft. There is no mass.     Tenderness: There is no abdominal tenderness.  Musculoskeletal:        General: Normal range of motion.     Cervical back: Neck supple.     Right lower leg: No edema.     Left lower leg: No edema.  Neurological:     General: No focal deficit present.     Mental Status: He is oriented to person, place, and time.     Cranial Nerves: No cranial nerve deficit.  Psychiatric:        Behavior: Behavior normal.      ASSESSMENT/PLAN:   Assessment & Plan Well adult exam Annual Examination  See AVS for age appropriate recommendations  PHQ score 8, reviewed and discussed.  Blood pressure reviewed and at goal. Not at goal    Considered the following items based upon USPSTF recommendations: Diabetes screening: ordered HIV testing:recently completed and result reviewed, normal  Hepatitis C: discussed and declined Hepatitis B:discussed and declined Syphilis if at high risk:  discussed and declined GC/CT not at high risk and not ordered. Lipid panel (nonfasting or fasting) discussed based upon AHA recommendations and patient declined.  Consider repeat every 4-6 years.  Reviewed risk factors for latent tuberculosis and not indicated Vaccinations Declined COVID-19 and flu shots. Tdap, PCV20 and Men B given today.   Follow up in 1 year or sooner if indicated.  MyChart Activation: Already signed up Moderate persistent asthma, unspecified whether complicated Stable on Symbicort  Recent PFT reviewed suggestive of moderate Asthma F/U with Pulm to discuss tezepelumab -ekko (TEZSPIRE ) 210 MG/1.  injection Monitor closely for now  Prediabetes A1C discussed and ordered Unfortunately, did not obtain prior to leaving I have reached out to him via MyChart to return for testing Essential hypertension Diastolic HTN Lifestyle modification discussed Reduce salt intake 30 mins moderate exercise 3-5 times per week DASH diet discussed Bmet, LDL offered, but he declined lab work today Monitor closely for now Consider med in the future Advised f/u in 4-8 weeks Morbid obesity (HCC) Lifestyle modification discussed 30 mins moderate exercise 3-5 times per week DASH diet discussed Bmet, LDL offered, but he declined lab work today Monitor closely for now Consider med in the future    Otto Fairly, MD [facility] Medicine Center

## 2024-01-11 NOTE — Patient Instructions (Addendum)

## 2024-02-12 ENCOUNTER — Ambulatory Visit: Admitting: Family Medicine

## 2024-03-01 ENCOUNTER — Ambulatory Visit (INDEPENDENT_AMBULATORY_CARE_PROVIDER_SITE_OTHER): Admitting: Family Medicine

## 2024-03-01 ENCOUNTER — Encounter: Payer: Self-pay | Admitting: Family Medicine

## 2024-03-01 ENCOUNTER — Ambulatory Visit: Payer: Self-pay | Admitting: Family Medicine

## 2024-03-01 VITALS — BP 125/81 | HR 101 | Ht 67.0 in | Wt 288.4 lb

## 2024-03-01 DIAGNOSIS — R7303 Prediabetes: Secondary | ICD-10-CM

## 2024-03-01 DIAGNOSIS — R059 Cough, unspecified: Secondary | ICD-10-CM

## 2024-03-01 DIAGNOSIS — R030 Elevated blood-pressure reading, without diagnosis of hypertension: Secondary | ICD-10-CM | POA: Diagnosis not present

## 2024-03-01 DIAGNOSIS — J4541 Moderate persistent asthma with (acute) exacerbation: Secondary | ICD-10-CM | POA: Diagnosis present

## 2024-03-01 LAB — POCT GLYCOSYLATED HEMOGLOBIN (HGB A1C): HbA1c, POC (prediabetic range): 5.8 % (ref 5.7–6.4)

## 2024-03-01 LAB — POC SOFIA 2 FLU + SARS ANTIGEN FIA
Influenza A, POC: NEGATIVE
Influenza B, POC: NEGATIVE
SARS Coronavirus 2 Ag: NEGATIVE

## 2024-03-01 MED ORDER — BUDESONIDE-FORMOTEROL FUMARATE 160-4.5 MCG/ACT IN AERO: 2.0000 | INHALATION_SPRAY | Freq: Two times a day (BID) | RESPIRATORY_TRACT | 1 refills | Status: AC

## 2024-03-01 NOTE — Progress Notes (Signed)
" ° ° °  SUBJECTIVE:   CHIEF COMPLAINT / HPI:   Discussed the use of AI scribe software for clinical note transcription with the patient, who gave verbal consent to proceed.  History of Present Illness   Caleb Clarke is a 24 year old male with asthma who presents for asthma follow up and coughing.  He has been experiencing worsening asthma symptoms over the past few days, characterized by fits of coughing and chest tightness. He uses his nebulizer every couple of hours, and his inhaler as needed, with the last use an hour before the visit due to chest tightness. Sometimes, he requires the nebulizer to achieve relief.  His symptoms resolve completely after treatment.  He is currently taking albuterol  as needed, Symbicort  two puffs twice daily, Zyrtec  10 mg daily, and Singulair  10 mg at bedtime. He needs a refill for Symbicort  as it is running low. He previously switched from Trelegy back to Symbicort  due to issues with the powder form of Trelegy. He was meant to start Tezpire with allergist but has not followed up.  He recalls seeing an allergist for his asthma, with the last visit being approximately two years ago. He has not reconnected with his asthma specialist for follow-up and does not have his contact information.  He reports a recent history of coughing, which has been present for the past three days and is associated with his asthma symptoms exacerbation. His mother was recently sick with symptoms including coughing and diarrhea, but he does not report any other sick contacts at home.  He is experiencing fatigue due to work and family responsibilities, including helping with his baby brother's child. He has reduced his physical activity, specifically walking, due to fatigue from work and family obligations.       PERTINENT  PMH / PSH: PMhx reviewed  OBJECTIVE:   BP 125/81   Pulse (!) 101   Ht 5' 7 (1.702 m)   Wt 288 lb 6.4 oz (130.8 kg)   SpO2 95%   BMI 45.17 kg/m   Physical  Exam   VITALS: BP- 125/81 Gen: No distress. Obese CHEST: Lungs clear to auscultation, no wheezing, no crackles. CARDIOVASCULAR: Heart regular rate and rhythm, normal S1, S2, no murmurs.       ASSESSMENT/PLAN:   Assessment & Plan Moderate persistent asthma with acute exacerbation Currently asymptomatic. Intermittent mild exacerbation due to cough with good response to Symbicort  and albuterol . Prefers Symbicort  over Trelegy due to powder residue issues. - Refilled Symbicort  prescription. - Sent referral to allergy  and immunology clinic for asthma management and potential tezepelumab -ekko (TEZSPIRE ) injection. - Ordered flu test> negative flu and covid test. I messaged him via MyChart about negative result. - Advised to report persistent breathing issues for potential oral steroid consideration. Cough, unspecified type Benign pulm exam No cough throughout this visit. Negative COVID-19 and influenza test Likely simple viral illness Monitor closely for now.  Prediabetes A1C done today > 5.8 Messaged him about result via MyChart Continue lifestyle modification.  Morbid obesity (HCC) Body mass index is 45.17 kg/m. Weight gain of four pounds since November due to reduced physical activity. Counseling provided Lab test recommended - he declined at this time. Elevated blood pressure reading Blood pressure well-controlled at 125/81. Prefers to reschedule blood tests. - Rescheduled blood tests for kidney, liver, cholesterol, and thyroid function. - Monitor BP closely     Otto Fairly, MD [facility] Center  "

## 2024-03-01 NOTE — Assessment & Plan Note (Addendum)
 Blood pressure well-controlled at 125/81. Prefers to reschedule blood tests. - Rescheduled blood tests for kidney, liver, cholesterol, and thyroid function. - Monitor BP closely

## 2024-03-01 NOTE — Assessment & Plan Note (Signed)
 A1C done today > 5.8 Messaged him about result via MyChart Continue lifestyle modification.

## 2024-03-01 NOTE — Patient Instructions (Signed)
 Obesity, Adult Obesity is having too much body fat. Being obese means that your weight is more than what is healthy for you.  BMI (body mass index) is a number that explains how much body fat you have. If you have a BMI of 30 or more, you are obese. Obesity can cause serious health problems, such as: Stroke. Coronary artery disease (CAD). Type 2 diabetes. Some types of cancer. High blood pressure (hypertension). High cholesterol. Gallbladder stones. Obesity can also contribute to: Osteoarthritis. Sleep apnea. Infertility problems. What are the causes? Eating meals each day that are high in calories, sugar, and fat. Drinking a lot of drinks that have sugar in them. Being born with genes that may make you more likely to become obese. Having a medical condition that causes obesity. Taking certain medicines. Sitting a lot (having a sedentary lifestyle). Not getting enough sleep. What increases the risk? Having a family history of obesity. Living in an area with limited access to: [place], recreation centers, or sidewalks. Healthy food choices, such as grocery stores and farmers' markets. What are the signs or symptoms? The main sign is having too much body fat. How is this treated? Treatment for this condition often includes changing your lifestyle. Treatment may include: Changing your diet. This may include making a healthy meal plan. Exercise. This may include activity that causes your heart to beat faster (aerobic exercise) and strength training. Work with your doctor to design a program that works for you. Medicine to help you lose weight. This may be used if you are not able to lose one pound a week after 6 weeks of healthy eating and more exercise. Treating conditions that cause the obesity. Surgery. Options may include gastric banding and gastric bypass. This may be done if: Other treatments have not helped to improve your condition. You have a BMI of 40 or higher. You have  life-threatening health problems related to obesity. Follow these instructions at home: Eating and drinking  Follow advice from your doctor about what to eat and drink. Your doctor may tell you to: Limit fast food, sweets, and processed snack foods. Choose low-fat options. For example, choose low-fat milk instead of whole milk. Eat five or more servings of fruits or vegetables each day. Eat at home more often. This gives you more control over what you eat. Choose healthy foods when you eat out. Learn to read food labels. This will help you learn how much food is in one serving. Keep low-fat snacks available. Avoid drinks that have a lot of sugar in them. These include soda, fruit juice, iced tea with sugar, and flavored milk. Drink enough water to keep your pee (urine) pale yellow. Do not go on fad diets. Physical activity Exercise often, as told by your doctor. Most adults should get up to 150 minutes of moderate-intensity exercise every week.Ask your doctor: What types of exercise are safe for you. How often you should exercise. Warm up and stretch before being active. Do slow stretching after being active (cool down). Rest between times of being active. Lifestyle Work with your doctor and a food expert (dietitian) to set a weight-loss goal that is best for you. Limit your screen time. Find ways to reward yourself that do not involve food. Do not drink alcohol if: Your doctor tells you not to drink. You are pregnant, may be pregnant, or are planning to become pregnant. If you drink alcohol: Limit how much you have to: 0-1 drink a day for women. 0-2 drinks  a day for men. Know how much alcohol is in your drink. In the [place] one drink equals one 12 oz bottle of beer (355 mL), one 5 oz glass of wine (148 mL), or one 1 oz glass of hard liquor (44 mL). General instructions Keep a weight-loss journal. This can help you keep track of: The food that you eat. How much exercise you  get. Take over-the-counter and prescription medicines only as told by your doctor. Take vitamins and supplements only as told by your doctor. Think about joining a support group. Pay attention to your mental health as obesity can lead to depression or self esteem issues. Keep all follow-up visits. Contact a doctor if: You cannot meet your weight-loss goal after you have changed your diet and lifestyle for 6 weeks. You are having trouble breathing. Summary Obesity is having too much body fat. Being obese means that your weight is more than what is healthy for you. Work with your doctor to set a weight-loss goal. Get regular exercise as told by your doctor. This information is not intended to replace advice given to you by your health care provider. Make sure you discuss any questions you have with your health care provider. Document Revised: 09/18/2020 Document Reviewed: 09/18/2020 [employer]vier Patient Education  2024 [employer].

## 2024-03-01 NOTE — Assessment & Plan Note (Addendum)
 Currently asymptomatic. Intermittent mild exacerbation due to cough with good response to Symbicort  and albuterol . Prefers Symbicort  over Trelegy due to powder residue issues. - Refilled Symbicort  prescription. - Sent referral to allergy  and immunology clinic for asthma management and potential tezepelumab -ekko (TEZSPIRE ) injection. - Ordered flu test> negative flu and covid test. I messaged him via MyChart about negative result. - Advised to report persistent breathing issues for potential oral steroid consideration.

## 2024-03-01 NOTE — Assessment & Plan Note (Addendum)
 Body mass index is 45.17 kg/m. Weight gain of four pounds since November due to reduced physical activity. Counseling provided Lab test recommended - he declined at this time.
